# Patient Record
Sex: Male | Born: 1935 | Race: White | Hispanic: No
Health system: Southern US, Community
[De-identification: ages and names within clinical notes are randomized; demographics above are authoritative.]

---

## 2004-07-30 ENCOUNTER — Ambulatory Visit: Payer: Self-pay

## 2006-12-07 ENCOUNTER — Ambulatory Visit: Payer: Self-pay | Admitting: Unknown Physician Specialty

## 2006-12-07 ENCOUNTER — Emergency Department: Payer: Self-pay | Admitting: General Practice

## 2006-12-07 ENCOUNTER — Other Ambulatory Visit: Payer: Self-pay

## 2007-02-01 ENCOUNTER — Encounter: Payer: Self-pay | Admitting: Physical Medicine and Rehabilitation

## 2007-02-27 ENCOUNTER — Encounter: Payer: Self-pay | Admitting: Physical Medicine and Rehabilitation

## 2007-03-29 ENCOUNTER — Encounter: Payer: Self-pay | Admitting: Physical Medicine and Rehabilitation

## 2007-07-06 ENCOUNTER — Other Ambulatory Visit: Payer: Self-pay

## 2007-07-06 ENCOUNTER — Emergency Department: Payer: Self-pay | Admitting: Emergency Medicine

## 2007-07-12 ENCOUNTER — Encounter: Payer: Self-pay | Admitting: Internal Medicine

## 2007-07-30 ENCOUNTER — Encounter: Payer: Self-pay | Admitting: Internal Medicine

## 2009-01-29 ENCOUNTER — Ambulatory Visit: Payer: Self-pay | Admitting: Unknown Physician Specialty

## 2009-03-04 ENCOUNTER — Ambulatory Visit: Payer: Self-pay | Admitting: Pain Medicine

## 2009-03-18 ENCOUNTER — Ambulatory Visit: Payer: Self-pay | Admitting: Physician Assistant

## 2009-04-08 ENCOUNTER — Ambulatory Visit: Payer: Self-pay | Admitting: Pain Medicine

## 2009-04-22 ENCOUNTER — Ambulatory Visit: Payer: Self-pay | Admitting: Pain Medicine

## 2009-05-15 ENCOUNTER — Ambulatory Visit: Payer: Self-pay | Admitting: Physician Assistant

## 2011-07-11 ENCOUNTER — Inpatient Hospital Stay: Payer: Self-pay | Admitting: *Deleted

## 2011-07-11 LAB — URINALYSIS, COMPLETE
Bacteria: NONE SEEN
Bilirubin,UR: NEGATIVE
Blood: NEGATIVE
Glucose,UR: NEGATIVE mg/dL (ref 0–75)
Ketone: NEGATIVE
Nitrite: NEGATIVE
Protein: NEGATIVE
Specific Gravity: 1.021 (ref 1.003–1.030)
Squamous Epithelial: NONE SEEN
WBC UR: 1 /HPF (ref 0–5)

## 2011-07-11 LAB — COMPREHENSIVE METABOLIC PANEL
Albumin: 3.6 g/dL (ref 3.4–5.0)
Alkaline Phosphatase: 44 U/L — ABNORMAL LOW (ref 50–136)
Anion Gap: 8 (ref 7–16)
BUN: 21 mg/dL — ABNORMAL HIGH (ref 7–18)
Bilirubin,Total: 0.4 mg/dL (ref 0.2–1.0)
Calcium, Total: 8.8 mg/dL (ref 8.5–10.1)
Co2: 28 mmol/L (ref 21–32)
EGFR (Non-African Amer.): >60
Glucose: 120 mg/dL — ABNORMAL HIGH (ref 65–99)
Osmolality: 291 (ref 275–301)
SGOT(AST): 20 U/L (ref 15–37)
Sodium: 144 mmol/L (ref 136–145)

## 2011-07-11 LAB — LIPASE, BLOOD: Lipase: 90 U/L (ref 73–393)

## 2011-07-11 LAB — CBC
HCT: 33.8 % — ABNORMAL LOW (ref 40.0–52.0)
HGB: 11.1 g/dL — ABNORMAL LOW (ref 13.0–18.0)
MCHC: 33 g/dL (ref 32.0–36.0)
MCV: 97 fL (ref 80–100)
RBC: 3.49 10*6/uL — ABNORMAL LOW (ref 4.40–5.90)
WBC: 7.4 10*3/uL (ref 3.8–10.6)

## 2011-07-11 LAB — PROTIME-INR: INR: 0.8

## 2011-07-13 LAB — CBC WITH DIFFERENTIAL/PLATELET
Basophil #: 0 10*3/uL (ref 0.0–0.1)
Eosinophil %: 0.9 %
HCT: 31.8 % — ABNORMAL LOW (ref 40.0–52.0)
HGB: 10.4 g/dL — ABNORMAL LOW (ref 13.0–18.0)
Lymphocyte #: 1.7 10*3/uL (ref 1.0–3.6)
Lymphocyte %: 27.3 %
MCV: 97 fL (ref 80–100)
Monocyte %: 13.3 %
Neutrophil %: 58.2 %
Platelet: 185 10*3/uL (ref 150–440)
RBC: 3.28 10*6/uL — ABNORMAL LOW (ref 4.40–5.90)
RDW: 14.5 % (ref 11.5–14.5)
WBC: 6.3 10*3/uL (ref 3.8–10.6)

## 2011-07-14 LAB — CBC WITH DIFFERENTIAL/PLATELET
Basophil %: 0.2 %
Eosinophil #: 0.1 10*3/uL (ref 0.0–0.7)
Eosinophil %: 1 %
HCT: 28.7 % — ABNORMAL LOW (ref 40.0–52.0)
HGB: 9.7 g/dL — ABNORMAL LOW (ref 13.0–18.0)
Lymphocyte #: 1.4 10*3/uL (ref 1.0–3.6)
MCH: 32.2 pg (ref 26.0–34.0)
MCV: 96 fL (ref 80–100)
Platelet: 179 10*3/uL (ref 150–440)
RBC: 3 10*6/uL — ABNORMAL LOW (ref 4.40–5.90)

## 2011-07-16 LAB — CBC WITH DIFFERENTIAL/PLATELET
Basophil %: 0.2 %
HCT: 32.5 % — ABNORMAL LOW (ref 40.0–52.0)
HGB: 11 g/dL — ABNORMAL LOW (ref 13.0–18.0)
Lymphocyte %: 22.4 %
MCV: 97 fL (ref 80–100)
Monocyte #: 1.2 10*3/uL — ABNORMAL HIGH (ref 0.0–0.7)
Monocyte %: 13.6 %
Neutrophil #: 5.8 10*3/uL (ref 1.4–6.5)
Platelet: 212 10*3/uL (ref 150–440)
RBC: 3.36 10*6/uL — ABNORMAL LOW (ref 4.40–5.90)
WBC: 9.1 10*3/uL (ref 3.8–10.6)

## 2011-10-07 ENCOUNTER — Ambulatory Visit: Payer: Self-pay | Admitting: Pain Medicine

## 2011-10-28 ENCOUNTER — Ambulatory Visit: Payer: Self-pay | Admitting: Pain Medicine

## 2012-11-27 ENCOUNTER — Emergency Department: Payer: Self-pay | Admitting: Emergency Medicine

## 2012-11-27 LAB — URINALYSIS, COMPLETE
Bilirubin,UR: NEGATIVE
Glucose,UR: >=500 mg/dL (ref 0–75)
Nitrite: NEGATIVE
RBC,UR: 54 /HPF (ref 0–5)
Specific Gravity: 1.013 (ref 1.003–1.030)

## 2012-11-27 LAB — COMPREHENSIVE METABOLIC PANEL
Albumin: 2.9 g/dL — ABNORMAL LOW (ref 3.4–5.0)
Anion Gap: 5 — ABNORMAL LOW (ref 7–16)
BUN: 16 mg/dL (ref 7–18)
Bilirubin,Total: 0.3 mg/dL (ref 0.2–1.0)
Calcium, Total: 8.8 mg/dL (ref 8.5–10.1)
Chloride: 104 mmol/L (ref 98–107)
Creatinine: 0.93 mg/dL (ref 0.60–1.30)
EGFR (African American): >60
EGFR (Non-African Amer.): >60
Glucose: 273 mg/dL — ABNORMAL HIGH (ref 65–99)
Potassium: 4.4 mmol/L (ref 3.5–5.1)
SGOT(AST): 18 U/L (ref 15–37)
SGPT (ALT): 18 U/L (ref 12–78)
Sodium: 137 mmol/L (ref 136–145)
Total Protein: 6.7 g/dL (ref 6.4–8.2)

## 2012-11-27 LAB — TROPONIN I: Troponin-I: <0.02 ng/mL

## 2012-11-27 LAB — CBC
HCT: 36.8 % — ABNORMAL LOW (ref 40.0–52.0)
MCH: 29.9 pg (ref 26.0–34.0)
MCHC: 33.1 g/dL (ref 32.0–36.0)
MCV: 90 fL (ref 80–100)
RBC: 4.09 10*6/uL — ABNORMAL LOW (ref 4.40–5.90)

## 2012-11-27 LAB — LIPASE, BLOOD: Lipase: 104 U/L (ref 73–393)

## 2013-10-23 ENCOUNTER — Inpatient Hospital Stay: Payer: Self-pay | Admitting: Internal Medicine

## 2013-10-23 LAB — URINALYSIS, COMPLETE
Bilirubin,UR: NEGATIVE
Ketone: NEGATIVE
NITRITE: NEGATIVE
PH: 5 (ref 4.5–8.0)
Specific Gravity: 1.016 (ref 1.003–1.030)
Squamous Epithelial: 1
WBC UR: 263 /HPF (ref 0–5)

## 2013-10-23 LAB — COMPREHENSIVE METABOLIC PANEL
Albumin: 2.6 g/dL — ABNORMAL LOW (ref 3.4–5.0)
Alkaline Phosphatase: 83 U/L
Anion Gap: 7 (ref 7–16)
BUN: 51 mg/dL — ABNORMAL HIGH (ref 7–18)
Bilirubin,Total: 0.2 mg/dL (ref 0.2–1.0)
Calcium, Total: 9.2 mg/dL (ref 8.5–10.1)
Chloride: 100 mmol/L (ref 98–107)
Co2: 26 mmol/L (ref 21–32)
Creatinine: 1.12 mg/dL (ref 0.60–1.30)
EGFR (African American): >60
GFR CALC NON AF AMER: 57 — AB
Glucose: 318 mg/dL — ABNORMAL HIGH (ref 65–99)
OSMOLALITY: 292 (ref 275–301)
Potassium: 4.3 mmol/L (ref 3.5–5.1)
SGOT(AST): 12 U/L — ABNORMAL LOW (ref 15–37)
SGPT (ALT): 14 U/L (ref 12–78)
Sodium: 133 mmol/L — ABNORMAL LOW (ref 136–145)
Total Protein: 6.7 g/dL (ref 6.4–8.2)

## 2013-10-23 LAB — CBC
HCT: 22.9 % — ABNORMAL LOW (ref 40.0–52.0)
HGB: 7.3 g/dL — ABNORMAL LOW (ref 13.0–18.0)
MCH: 29.3 pg (ref 26.0–34.0)
MCHC: 31.9 g/dL — ABNORMAL LOW (ref 32.0–36.0)
MCV: 92 fL (ref 80–100)
Platelet: 305 10*3/uL (ref 150–440)
RBC: 2.49 10*6/uL — ABNORMAL LOW (ref 4.40–5.90)
RDW: 15.6 % — ABNORMAL HIGH (ref 11.5–14.5)
WBC: 14.6 10*3/uL — AB (ref 3.8–10.6)

## 2013-10-23 LAB — TROPONIN I: Troponin-I: 0.13 ng/mL — ABNORMAL HIGH

## 2013-10-24 LAB — CBC WITH DIFFERENTIAL/PLATELET
Basophil #: 0 10*3/uL (ref 0.0–0.1)
Basophil %: 0.1 %
EOS PCT: 1 %
Eosinophil #: 0.1 10*3/uL (ref 0.0–0.7)
HCT: 22 % — ABNORMAL LOW (ref 40.0–52.0)
HGB: 7.2 g/dL — ABNORMAL LOW (ref 13.0–18.0)
LYMPHS PCT: 18.9 %
Lymphocyte #: 1.9 10*3/uL (ref 1.0–3.6)
MCH: 29.8 pg (ref 26.0–34.0)
MCHC: 32.9 g/dL (ref 32.0–36.0)
MCV: 91 fL (ref 80–100)
Monocyte #: 1.9 x10 3/mm — ABNORMAL HIGH (ref 0.2–1.0)
Monocyte %: 18.9 %
Neutrophil #: 6.1 10*3/uL (ref 1.4–6.5)
Neutrophil %: 61.1 %
Platelet: 250 10*3/uL (ref 150–440)
RBC: 2.43 10*6/uL — AB (ref 4.40–5.90)
RDW: 14.9 % — AB (ref 11.5–14.5)
WBC: 10 10*3/uL (ref 3.8–10.6)

## 2013-10-24 LAB — BASIC METABOLIC PANEL
ANION GAP: 5 — AB (ref 7–16)
BUN: 41 mg/dL — AB (ref 7–18)
CO2: 28 mmol/L (ref 21–32)
Calcium, Total: 7.9 mg/dL — ABNORMAL LOW (ref 8.5–10.1)
Chloride: 105 mmol/L (ref 98–107)
Creatinine: 0.98 mg/dL (ref 0.60–1.30)
EGFR (African American): >60
EGFR (Non-African Amer.): >60
Glucose: 140 mg/dL — ABNORMAL HIGH (ref 65–99)
Osmolality: 288 (ref 275–301)
POTASSIUM: 3.7 mmol/L (ref 3.5–5.1)
SODIUM: 138 mmol/L (ref 136–145)

## 2013-10-25 ENCOUNTER — Ambulatory Visit: Payer: Self-pay | Admitting: Internal Medicine

## 2013-10-25 LAB — URINE CULTURE

## 2013-11-26 DEATH — deceased

## 2014-10-19 NOTE — H&P (Signed)
PATIENT NAME:  Lance Sanchez, Lance Sanchez MR#:  [id number] DATE OF BIRTH:  Dec 28, 1919  DATE OF ADMISSION:  10/23/2013  PRIMARY CARE PHYSICIAN: Dr. Hyacinth Meeker.    REFERRING EMERGENCY ROOM PHYSICIAN: Dr. Mayford Knife.   CHIEF COMPLAINT: Drowsiness and fall.   HISTORY OF PRESENT ILLNESS:  A 81 year old male with a past medical history of diabetes, hypertension, benign prostatic hypertrophy, craniotomy in the past for subdural hematoma, chronic dementia; lives at home with 24/7 home care with diverticular bleed in 2013. Had a fall on the 24th of April but then was not complaining much so family just continued taking care of him at home but then, for the last 2 days, he gradually started becoming more and more sleepy and remaining more drowsy but arousable so they finally he decided today to bring him to the Emergency Room. On workup he was found having a low hemoglobin. CT scan of the abdomen was done which showed there is a small displaced fracture of his rib with small hemothorax. His stool is also positive as per ER physician's rectal examination and also found having a urinary tract infection so he was given to hospitalist service for further management of this issue. History obtained from patient's son, who is present in the room. He is also healthcare power of attorney named Lance Sanchez.    REVIEW OF SYSTEMS: Unable to get it as the patient is currently demented and drowsy.   PAST MEDICAL HISTORY: 1.  Diabetes.  2.  Hypertension.  3.  History of benign prostatic hypertrophy.  4.  Craniotomy in the past because of cervical hematoma.  5.  Right femoral fracture, treated at [facility] orthopedics.  6.  Diverticular bleed in 2013.   The patient's son is questionable about history of hypertension as the patient does not require any blood pressure medications and pressure in remaining normal.   PAST SURGICAL HISTORY: 1.  Neck surgery.  2.  Appendectomy.  3.  Prostate biopsy.  4.  Bilateral inguinal hernia  repair.  5.  Hydrocele repair.  6.  Craniotomy for subdural hematoma in [facility].  7.  Surgery of right femoral neck fracture.   FAMILY HISTORY: Positive for hypertension.   SOCIAL HISTORY: The patient lives at home with 24/7 care, able to walk minimal with walker but still requires 1 person to hold him. He also requires somewhat assistance in feeding and has terminal dementia, not having any meaningful conversation with any family members. He is nonsmoker. He still chews tobacco. No alcohol use. No illegal drug use. 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   HOME MEDICATIONS:  1.  Tamsulosin 0.4 mg oral tablet once a day.  2.  Repaglinide 0.5 mg oral tablet once a day.  3.  Multivitamin tablet with iron once a day.  4.  Docusate sodium 100 mg oral tablet once a day.  5.  Avodart 0.5 mg oral once a day.  6.  Acetaminophen extended-release tablet 650 mg as needed 8 eight hours for pain.   PHYSICAL EXAMINATION: VITAL SIGNS: In the ER, temperature 97.6, pulse 97, respirations 18, blood pressure 101/60 and pulse oximetry is 96% on room air.  GENERAL: The patient is currently drowsy and he is confused, not oriented but arousable to shake and opens eyes and speaks few nonmeaningful words. HEENT: Head and neck atraumatic. Conjunctivae pale. Oral mucosa moist.  NECK: Supple. No JVD.  RESPIRATORY: Bilateral equal air entry. There is tenderness on local pressure on the left side  of posterior chest.    CARDIOVASCULAR: S1, S2 present, murmur present, regular.  ABDOMEN: Soft, nontender, bowel sounds present, no organomegaly.  SKIN: No rashes.  LEGS: No edema.  NEUROLOGICAL: Power 4 out of 5. Moves all 4 limbs but does not follow commands because of dementia. No rigidity or tremors.   LABORATORY, DIAGNOSTIC AND RADIOLOGICAL DATA:  CT abdomen and pelvis with contrast:  1.  Displaced fracture of the posterolateral aspect of left 10th rib.  2.  Small left hemothorax associated with left basilar  segmental atelectasis.  3.  No evidence of acute injury to the abdomen or pelvis.  4.  Cholelithiasis. 5.  Colonic diverticular disease without CT evidence of active inflammation.    6.  Irregular and diffuse intractable bladder wall with left posterolateral diverticulum, overall finding appears similar compared to past.  7.  Chronic-appearing T12 fracture.  8.  IVC filter.  9.  Diffuse atherosclerotic vascular dilatation.   CT head without contrast is done, diffuse cortical atrophy, mild chronic ischemia, stable bilateral chronic hygromas compared to prior exam, no acute intracranial abnormality. Glucose 318, BUN 51, creatinine 1.12, sodium 133, potassium is 4.3, chloride is 100, CO2 is 26, calcium is 9.2.   Total protein 6.7, albumin is 2.6, bilirubin 0.2, alkaline phosphatase 83, SGOT 12 and SGPT is 14.   Troponin 0.13.   WBC 14.6, hemoglobin 7.3, platelet count is 305, MCV is 92. Urinalysis is positive with 263 WBCs and 3+ leukocyte esterase.   ASSESSMENT AND PLAN: A 81 year old male with past medical history of dementia, diabetes, hypertension and on 24/7 home care brought by family after a fall and feeling remaining drowsy and not very oriented.  1.  Altered mental status due to metabolic encephalopathy due to urinary tract infection. The patient has baseline dementia but he remains alert. No meaningful conversation, but for the last 2 to 3 days he is remaining more drowsy. This is most likely secondary to urinary tract infection. We will give Rocephin IV and get urine culture for further evaluation.  2.  Gastrointestinal bleed and drop in hemoglobin. As per Emergency Room physician stool is positive for heme on rectal exam.  Hemoglobin last was 12 in the records and currently it is 7. Blood transfusion ordered by ER physician and we will continue monitoring. Will follow CBC tomorrow morning. Gastroenterology consult. Proton pump inhibitor IV b.i.d. and I avoid all anticoagulant medications.   3.  Diabetes. Currently, because of drowsiness his oral intake will be questionable  and I would like to avoid his diabetes medication but just would like to put him on low-dose sliding scale coverage with fingerstick check to prevent excessive hyperglycemia.  4.  Rib fracture and small hemothorax. As per CAT scan today, it is very small. The patient had a fall 4 days ago and still it is very small so I do not think it needs any particular intervention but if it keeps on increasing, then we might need to call cardiothoracic surgery consult  5.  Weakness and history of fall. Will call physical therapy evaluation. The patient is having a 24/7 care anyway.  6.  Overall prognosis is very poor because of multiple medical issues and extreme old age. I discussed with the patient's son, who is healthcare power of attorney, Mr. Lance Sanchez.  He wants to keep the patient as DO NOT RESUSCITATE in any adverse event. Plan explained to him about gastrointestinal bleed, urinary tract infection and rib fracture as mentioned above and he  agreed with the plan.   TOTAL TIME SPENT ON THIS ADMISSION: 50 minutes.    ____________________________ Hope Pigeon Elisabeth Pigeon, MD vgv:cs D: 10/23/2013 19:49:00 ET T: 10/23/2013 20:24:01 ET JOB#: [id number]  cc: Hope Pigeon. Elisabeth Pigeon, MD, <Dictator> Altamese Dilling MD ELECTRONICALLY SIGNED 11/05/2013 22:15

## 2014-10-19 NOTE — Discharge Summary (Signed)
PATIENT NAME:  Lance Sanchez, Lance Sanchez MR#:  [id number] DATE OF BIRTH:  January 18, 1920  DATE OF ADMISSION:  10/23/2013 DATE OF DISCHARGE:  10/24/2013  ADMITTING DIAGNOSIS: Fall, drowsiness.   DISCHARGE DIAGNOSES:  1.  Acute encephalopathy due to urinary tract infection, anemia, now patient made comfort care due to progression of his dementia.  2.  Gastrointestinal bleed. No further workup wanted by family. Hemoglobin 7. They did not wish for him to be transfused. The patient is made comfort care and transferred to hospice home.  3.  Diabetes.  4.  Rib fracture with small pneumothorax.  5.  History of benign prostatic hypertrophy.  6.  History of cranial hematoma in the past with craniotomy. 7.  History of right femoral fracture in the past.  8.  History of the diverticular bleed in 2013.  9.  Status post neck surgery, appendectomy, prostate biopsy, bilateral inguinal hernia repair, hydrocele repair, craniotomy and right femoral neck fracture repair.   CONSULTANTS: Palliative care.  LABS AND EVALUATIONS: CT scan of the head showed diffuse cortical atrophy, mild chronic ischemia and bilateral chronic hygromas, unchanged. Glucose 318, BUN 51, creatinine 1.12, sodium 133, potassium 4.3, chloride 100, CO2 26, calcium 9.2. LFTs: Total protein 6.7, albumin 2.6, bilirubin total 0.2, alk phos 83, AST 12, ALT 14. Troponin 0.13. WBC 14.6, hemoglobin 7.3. Urinalysis positive at 263 WBCs and 3+ leukocytes.   HOSPITAL COURSE: Please refer to H and P done by the admitting physician. The patient is a 81 year old white male with history of progressive dementia and other medical problems, who fell on April 24. He was not complaining much of pain, but was brought into the ED with an increase in drowsiness and more confusion. He has baseline dementia and was being cared for at home. He underwent evaluation, which showed a small displaced fracture with small hemothorax. He also was noted to have a urinary tract infection and  we were asked to admit the patient. The patient also was found to have drop in his hemoglobin and was guaiac-positive. There is some concern for chronic GI blood loss. Due to these symptoms, he was admitted and placed on IV antibiotics and IV fluids. A GI consult was obtained as well as palliative care consult was obtained. The patient was seen by palliative care and case was discussed with the family and the son, healthcare power of attorney. He felt that the patient has progressively declined and with his dementia he did not want any aggressive treatment and wanted him to be comfortable much as possible. Therefore, hospice was consulted. Te patient was felt appropriate for home hospice. Therefore, he is being discharged to a hospice facility.   DISCHARGE ACTIVITY: As tolerated.   DISCHARGE MEDICATIONS: Lumigan 0.01% ophthalmic solution 1 drop to each eye in the evening, bimatoprost 0.01% ophthalmic drop to each eye at bedtime, morphine 20 mg per mL, 0.13 mL q.4 hours p.r.n. and Ativan 0.5 q.4 hours p.r.n. anxiety.   DISPOSITION: To hospice home.  TIME SPENT: 32 minutes spent on this discharge.  ____________________________ Lafonda Mosses. Posey Pronto, MD shp:aw D: 10/25/2013 07:48:19 ET T: 10/25/2013 08:01:18 ET JOB#: [id number]  cc: Calil Amor H. Posey Pronto, MD, <Dictator> Alric Seton MD ELECTRONICALLY SIGNED 10/26/2013 14:05

## 2014-10-20 NOTE — H&P (Signed)
PATIENT NAME:  Lance Sanchez, Lance Sanchez MR#:  [id number] DATE OF BIRTH:  09-Apr-1920  DATE OF ADMISSION:  07/11/2011  PRIMARY CARE PHYSICIAN: Silver Huguenin, MD  CHIEF COMPLAINT: Bright red blood per rectum today.   HISTORY OF PRESENT ILLNESS: History is obtained from the patient, old records and the patient's daughter.   HISTORY OF PRESENT ILLNESS: Mr. [name] is an 81 year old Caucasian gentleman with history of diabetes and benign prostatic hypertrophy who comes to the Emergency Room accompanied by his daughter and son with the above-mentioned chief complaint. The patient's daughter reports that around 3 o'clock  in the morning she heard the patient in the bathroom and went to check on him and found a lot of blood in the commode, outside the commode and in the bathroom along with some blood clots near the patient's bed. The patient denied any abdominal pain. He had another episode while they were trying to clean him up in the shower and they decided to bring him to the Emergency Room for further evaluation and management. The patient does have history of diverticulosis per colonoscopy done several years ago. He is hemodynamically stable. He is being admitted for further evaluation and management. The patient denies any abdominal pain, nausea, vomiting, or any recent intestinal infection.   PAST MEDICAL HISTORY:  1. Diabetes.  2. Hypertension.  3. History of benign prostatic hypertrophy.  4. Craniotomy in the past for subdural hematoma.  5. Right femoral neck fracture, treated by [facility].  PAST SURGICAL HISTORY:  1. Neck surgery  2. Appendectomy.  3. Prostate biopsy.  4. Bilateral inguinal hernia repair.  5. Hydrocele repair.  6. Craniotomy for subdural hematoma at [facility].  7. Surgery for right femoral neck fracture.   ALLERGIES: No known allergies.   MEDICATIONS:  1. Aspirin 81 mg daily.  2. Avodart 0.5 mg daily.  3. Calcium with vitamin D p.o. daily.  4. Fish oil p.o. daily.   5. Flomax 0.4 mg p.o. daily.  6. Glucosamine 500 mg p.o. daily.  7. Multivitamin p.o. daily.  8. Prandin 0.5 mg p.o. daily.  9. Tramadol 50 mg two tablets every six hours as needed for pain.  10. Travatan 0.004% ophthalmic solution one drop in each eye daily.  11. Tylenol Arthritis.  FAMILY HISTORY: Positive for hypertension.   REVIEW OF SYSTEMS: CONSTITUTIONAL: No fever, fatigue, or weakness. EYES: No blurred or double vision. ENT: No blurred or double vision. Positive for glaucoma. ENT: No tinnitus, ear pain, or hearing loss. RESPIRATORY: No cough, wheeze, or hemoptysis. CARDIOVASCULAR: No chest pain, orthopnea, or edema. GASTROINTESTINAL: Positive for rectal bleed No nausea, vomiting, diarrhea, or abdominal pain. No GERD. GU: No dysuria or hematuria. ENDOCRINE: No polyuria or nocturia. HEMATOLOGY: Positive for anemia. SKIN: No acne or rash. MUSCULOSKELETAL: Positive for arthritis. NEUROLOGIC: No cerebrovascular accident or transient ischemic attack. PSYCH: No anxiety or depression. All other systems are reviewed and negative.   PHYSICAL EXAMINATION:   GENERAL: Awake, alert, and oriented x3, not in acute distress.   VITAL SIGNS: Afebrile, pulse 78, blood pressure 130/73, and saturations are 100% on room air.   HEENT: Atraumatic, normocephalic. Pupils are equal, round, and reactive to light and accommodation. Extraocular movements intact. Oral mucosa is moist.   NECK: Supple. No JVD. No carotid bruit.   LUNGS: Clear to auscultation bilaterally. No rales, rhonchi, respiratory distress, or labored breathing.   HEART: Both heart sounds are normal. Rate and rhythm is regular. PMI is not lateralized. Chest is nontender.   EXTREMITIES: Good  pedal pulses, good femoral pulses. No lower extremity edema.   ABDOMEN: Soft, benign, and nontender. No organomegaly.  RECTAL: Hemoccult shows bright red blood, per emergency room physician.  NEUROLOGIC: Cranial nerves II through XII grossly intact.  No motor or sensory deficits.   PSYCH: Awake, alert, and oriented x3.   SKIN: Warm and dry.  LABS/STUDIES: CT of the head without contrast: No acute intracranial findings. There are findings suggesting small chronic CSF hygroma along the left parietal convexity similar to prior, chronic small vessel ischemic disease.  Hemoglobin and hematocrit is 11.1 and 33.8, and platelet count 203. Lipase is 90. Comprehensive metabolic panel is within normal limits, except glucose of 120, BUN 21, chloride 108, and alkaline phosphatase 44. PT and INR are 11.8 and 0.8. Troponin is 0.03.   Chest x-ray: No acute cardiopulmonary disease.   ASSESSMENT: 81 year old Mr. Lance Sanchez with:  1. Rectal bleed, acute, and appears to be diverticular at present. We need to rule out other causes like ischemic colitis versus malignancy, which is less likely. The patient had acute bright red blood per rectum with clots this morning without abdominal pain which favors more for diverticular bleed. He has had colonoscopy in the past, about 10 years ago, which did show diverticulosis.  2. Benign prostatic hypertrophy.  3. Type 2 diabetes.  4. Glaucoma.   PLAN:  1. Admit the patient to the medical floor.  2. Continue IV fluids.  3. NPO except ice and medications.  4. Bleeding scan if the patient has another bleed. Vascular consult if needed 5. PRN. in and out catheter since the patient has history of significant benign prostatic hypertrophy.  6. TED hose for deep vein thrombosis prophylaxis.   Further work-up according to the patient's clinical course. The hospital admission plan was discussed with the patient and the patient's family members who are agreeable to it.  CODE STATUS: FULL CODE.  TIME SPENT: 50 minutes. ____________________________ Wylie HailSona A. Allena KatzPatel, MD sap:slb D: 07/11/2011 12:06:38 ET T: 07/11/2011 12:38:47 ET JOB#: [id number]  cc: Danikah Budzik A. Allena KatzPatel, MD, <Dictator> Yetta FlockAileen H. Miller, MD Willow OraSONA A Saamir Armstrong  MD ELECTRONICALLY SIGNED 07/13/2011 15:25

## 2014-10-20 NOTE — Consult Note (Signed)
Chief Complaint:   Subjective/Chief Complaint disoriented, poorly cooperative-"sundowning"  denies abd pain, no vomiting reported.  several small bm, bloody. hemodynamically stable   VITAL SIGNS/ANCILLARY NOTES: **Vital Signs.:   14-Jan-13 14:02   Vital Signs Type Routine   Temperature Temperature (F) 98   Celsius 36.6   Temperature Source oral   Pulse Pulse 91   Pulse source per Dinamap   Respirations Respirations 20   Systolic BP Systolic BP 165   Diastolic BP (mmHg) Diastolic BP (mmHg) 70   Mean BP 101   BP Source Dinamap   Pulse Ox % Pulse Ox % 96   Pulse Ox Activity Level  At rest   Oxygen Delivery Room Air/ 21 %  *Intake and Output.:   14-Jan-13 14:01   Stool  medium sized amount of dark colored stool.  when I wiped him it was a lot of blood.    14:54   Stool  small soft stool, dark red    17:05   Stool  small amount of blood stool when cleaned bottom,  none in depends.   Brief Assessment:   Cardiac Regular    Respiratory clear BS    Gastrointestinal details normal Soft  Nontender  Nondistended  No masses palpable   Routine Hem:  14-Jan-13 02:06    Hemoglobin (CBC) 9.4    10:06    Hemoglobin (CBC) 10.4   Radiology Results: Nuclear Med:    13-Jan-13 22:50, GI Blood Loss Study - Nuc Med   GI Blood Loss Study - Nuc Med    REASON FOR EXAM:    LGI bleed  COMMENTS:       PROCEDURE: NM  - NM GI BLOOD LOSS STUDY  - Jul 11 2011 10:50PM     RESULT: Comparison: None.    Radiopharmaceutical: 21.09 mCi technetium 107100m.    Technique: Standard departmental tagged red cell scan wasperformed with   monitoring via anterior planar images.    Findings:  There is normal distribution of radiotracer activity. There is a small   focus of radiotracer activity just lateral to the left iliac vessels.     This persists throughout the study, unchanged in intensity or morphology.   It does not peristalse. This is of uncertain etiology, but may a small   vascular anomaly or an  area of inflammation. No areas of peristalsing   radiotracer activity to suggest active hemorrhage into the bowel.    There is radiotracer activity overlying the region of the penis, likely   secondary to urinary contamination.    IMPRESSION:     1. No evidence of active hemorrhage into the bowel.  2. Small focus of radiotracer activity just lateral to the leftiliac   vessels is of uncertain etiology. This could represent a small vascular   anomaly or an area of inflammation.      Verified By: Lewie ChamberOBERT L. SUBER, M.D., MD   Assessment/Plan:  Assessment/Plan:   Assessment 1) hematochezia-likely diverticular bleeding.  stable not recurrent, passing old bleed.  DDX including anal outlet or avm's.  hgb stable.  negative bleeding scan last night.  2) ams/early dementia    Plan 1) continue current, recheck cbc in am, transfuse as needed. following.   Electronic Signatures: Barnetta ChapelSkulskie, Allah Reason (MD)  (Signed 14-Jan-13 18:04)  Authored: Chief Complaint, VITAL SIGNS/ANCILLARY NOTES, Brief Assessment, Lab Results, Radiology Results, Assessment/Plan   Last Updated: 14-Jan-13 18:04 by Barnetta ChapelSkulskie, Armend Hochstatter (MD)

## 2014-10-20 NOTE — Consult Note (Signed)
PATIENT NAME:  Lance Sanchez, VINER MR#:  [id number] DATE OF BIRTH:  1920/03/07  DATE OF CONSULTATION:  07/11/2011  REFERRING PHYSICIAN:  Dr. Enedina Finner  CONSULTING PHYSICIAN:  Christena Deem, MD  REASON FOR CONSULTATION: Hematochezia.   HISTORY OF PRESENT ILLNESS: Mr. Melody is an 81 year old Caucasian male who was brought to the Emergency Room today by his family members with some problems of rectal bleeding. Patient is unable to give much in the way of medical history in that he has some significant amount of dementia. Apparently patient was okay yesterday. He has perhaps had some increased problems with constipation or bowel habit changes recently. This morning it was noted that there was some blood on the floor in the bathroom with some clots, this occurring before 6:00. This was then repeated after a shower at 8:30 with some brighter red bleeding associated with some dark clots as well whereupon he was brought to the hospital. He denies any problems with abdominal pain, nausea, or vomiting. He does have heartburn for which he takes TUMS on a regular basis. There is no apparent dysphagia. He has never had a GI bleed in the past according to family members present in the room. Most of the history is obtained from either one of his sons, his daughter or daughter-in-law. Typically he has a near daily bowel movement. He has possibly had diverticulitis in the past. GI family history is pertinent for a younger brother having colon cancer. Patient's last colonoscopy was 07/30/2004 with finding of diverticulosis at that time. He apparently has never had colon polyps. He has not had a repeat colonoscopy due to these age-related issues. He is hemodynamically stable. He has not had a repeat episode of bleeding since he came to the hospital.   PAST MEDICAL HISTORY:  1. History of non-insulin-dependent diabetes.  2. Hypertension.  3. Benign prostatic hypertrophy.  4. He has had a subdural hematoma in the past  requiring craniotomy.  5. He has had a right femoral neck fracture treated at [facility].   PAST SURGICAL HISTORY: 1. Neck surgery. 2. Appendectomy. 3. Prostate biopsy. 4. Bilateral inguinal herniorrhaphy. 5. Hydrocele repair.  6. Craniotomy as noted.  7. Surgery for his right femoral neck fracture.   OUTPATIENT MEDICATIONS:  1. Avodart 0.5 mg once a day. 2. Flomax 0.4 mg. 3. Multivitamin.  4. Prandin 0.5 mg t.i.d. with meals. 5. Tamsulosin as noted.  6. Travatan eyedrops. 7. Tylenol arthritis. 8. Vitamin supplement for hair.  9. In the past patient has been on calcium supplements, fish oil, glucosamine and tramadol of which these he is no longer taking.   ALLERGIES: He has no known drug allergies.   REVIEW OF SYSTEMS: Systems reviewed per initial history and physical, agree with same.   PHYSICAL EXAMINATION:  VITAL SIGNS: Temperature 98.2, pulse 75, respirations 18, blood pressure 162/74, pulse oximetry 96%.   GENERAL: He is a well-appearing 81 year old Caucasian male, no acute distress.   HEENT: Normocephalic, atraumatic. Eyes are anicteric. Nose: Septum midline. Oropharynx: He has a plate upper and lower. There are no lesions.   NECK: Supple. No JVD.   HEART: Regular rate and rhythm.   LUNGS: Bilaterally clear.   ABDOMEN: Soft. There is some mild tenderness to palpation in the left lower quadrant. There are no masses, rebound, organomegaly. The tenderness in the left lower quadrant is repeatable on subsequent exam.   RECTAL: Anorectal exam deferred.   EXTREMITIES: No clubbing, cyanosis. He does have a 1 to 2+ lower extremity  edema.   NEUROLOGICAL: Cranial nerves II through XII grossly intact. Muscle strength bilaterally equal and symmetric.   LABORATORY, DIAGNOSTIC, AND RADIOLOGICAL DATA: Serum glucose 120, BUN 21, creatinine 1.08, sodium 144, potassium 4.2, chloride 108, bicarbonate 28, calcium 8.8, lipase 90. Hepatic profile showing total protein 6.6, albumin 3.6, total  bilirubin 0.4, alkaline phosphatase 44, AST 20, ALT 21. He has had troponin x1 which was 0.03. Hemogram shows white count 7.4, hemoglobin and hematocrit 11./33.8, platelet count 203. MCV 97. He has been typed and screened. Pro time 11.9 with an INR 0.8. Urinalysis was normal. He had a CT scan of the head without contrast in regards to altered mental status and history of intracranial bleed in the past. No acute intracranial findings. 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 Chronic small vessel ischemic disease. He has had a portable chest film showing no acute cardiopulmonary disorder.   ASSESSMENT: Hematochezia. This is in the setting of a known history of diverticulosis in the past. There is a question as to whether he may have had diverticulitis in the past. He has some mild tenderness in the left lower quadrant. He does not state that he has had any pain recently. Family members also verify that he has not had any complaint of pain even with this episode. Most likely this is a diverticular bleed. Differential diagnosis to include anal outlet bleeding as well as diverticular bleeding or AVM type bleeding. Doubt that this would be ischemic colitis. I am concerned that with the mild discomfort in the left lower quadrant he may have some amount of chronic diverticulitis. He is hemodynamically stable and is not yet at a point requiring transfusion. He has had no repeat episode of bleeding for over six hours.   RECOMMENDATIONS:  1. Serial hemoglobins; would repeat q.8 hours x3 then every day. Transfuse as needed.  2. Consider acid blocker for prophylaxis H2 RA versus PPI.  3. Will consider placing him on Cipro and Flagyl in regards to left lower quadrant discomfort. In review, he does not have a high white count.       4. Should he have repeat bleeding, would do a bleeding scan to help with localization as well as a vascular surgery  consult in regards to possible microembolization.   ____________________________ Christena DeemMartin U. Skulskie, MD mus:cms D: 07/11/2011 16:01:20 ET T: 07/12/2011 05:18:04 ET JOB#: [id number]  cc: Christena DeemMartin U. Skulskie, MD, <Dictator> Danella PentonMark F. Miller, MD Christena DeemMARTIN U SKULSKIE MD ELECTRONICALLY SIGNED 07/20/2011 17:26

## 2014-10-20 NOTE — Discharge Summary (Signed)
PATIENT NAME:  Lance Sanchez, Lance Sanchez MR#:  [id number] DATE OF BIRTH:  08/12/1919  DATE OF ADMISSION:  07/11/2011 DATE OF DISCHARGE:  07/18/2011  DISCHARGE DIAGNOSES:  1. Rectal bleed secondary to diverticular bleed.  2. Diabetes.  3. Dementia.  4. Benign prostatic hypertrophy.  5. Hypertension.  6. History of craniotomy in the past for subdural hematoma. 7. Previous history of right femoral neck fracture.   CONSULTATION: Gastroenterology, Dr. Marva PandaSkulskie.   HISTORY: This is an 81 year old male who has history of suspected dementia, diabetes, hypertension, who presented with rectal bleed. The daughter found a lot of blood in the commode. He denied any abdominal pain at that time. He was admitted as painless rectal bleed, possibly diverticular. When he came in, his hemoglobin was 11.1. He had a nuclear bleeding scan done. There was no evidence of active hemorrhage in the bowel. Small focus of radiotracer activity just lateral to the left iliac vessels which could be a vascular anomaly or area of inflammation. He also had a CT of the head done in the Emergency Room because of altered mental status that was essentially negative. 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 He was kept n.p.o. His hemoglobin was monitored. His hemoglobin remained stable in the range of 9.4 to 10.4. His last hemoglobin checked on 01/18 actually improved to 11. He did not require any packed RBC transfusion. His INR when he came in was normal at 0.8. His platelet count was normal at 2,000 or 3000. Dr. Marva PandaSkulskie followed him during the hospital stay. He also had episodes of disorientation and sundowning during the hospital stay. He had to be given Risperdal for agitation, but he is back to his baseline. Most likely he has early dementia. He has been started on a low residue diet for his diverticular bleed. He is tolerating it well. It can be transitioned to 1,800 calorie ADA  diet after a week. We will also start him on MiraLAX as needed. Initially, the patient was planned to be discharged to home, but he did not do well with physical therapy, so the patient is getting discharged to short-term rehab.   DISCHARGE MEDICATIONS:  1. Multivitamin daily.  2. Tylenol Arthritis extended-release 650 milligrams one tab every eight hours as needed. 3. Avodart 0.5 mg daily.  4. Tamsulosin 0.4 mg daily. 5. Vitamins for hair tablet once daily.  6. Travatan eye drops one drop to each affected eye once a day in the evening. 7. Prandin 0.5 mg p.o. at bedtime. 8. Risperdal 0.5 mg at bedtime as needed for agitation. 9. MiraLAX 17 grams p.o. once daily as needed for constipation.   DIET: 1,800 calorie ADA low residue diet.   ACTIVITY: As tolerated. Physical therapy at short-term rehab.   CONDITION ON DISCHARGE: He is comfortable. His T-max 98, heart rate 75, blood pressure 145/72, oxygen saturation 96% on room air. Chest is clear. Heart sounds are regular. Abdomen soft, nontender. He has no further bleed. He is tolerating his diet.   FOLLOWUP: The patient should follow-up with M.D. at short-term rehab or Dr. Silver HugueninAileen Sanchez in one week.   TIME SPENT WITH DISCHARGE: 35 minutes.   ____________________________ Fredia SorrowAbhinav Jarom Govan, MD ag:ap D: 07/18/2011 11:42:31 ET T: 07/18/2011 12:05:05 ET JOB#: [id number]  cc: Fredia SorrowAbhinav Lilliemae Fruge, MD, <Dictator> Yetta FlockAileen Sanchez. Miller, MD  Fredia SorrowABHINAV Ena Demary MD ELECTRONICALLY SIGNED 08/18/2011 17:58

## 2014-10-20 NOTE — Consult Note (Signed)
Brief Consult Note: Diagnosis: hematochezia.   Patient was seen by consultant.   Consult note dictated.   Recommend further assessment or treatment.   Orders entered.   Discussed with Attending MD.   Comments: Please see GI consult [phone]#[id number].  Paitent with dark to bright red rectal bleeding this am.  Hemodynamically stable.  No repeat for over 6 hours.  Mild abdominal pain in the left lower quadrant.  Serial hgb, transfuse as needed.  For repeat bleeding would do GI bleeding scan and consult vascular surgery re possible microembolization.  Low threshold for starting ivabx with the mild llq pain should he have repeat bleeding. Following. Thank you for this consult.  Electronic Signatures: Barnetta ChapelSkulskie, Karolynn Infantino (MD)  (Signed 13-Jan-13 16:05)  Authored: Brief Consult Note   Last Updated: 13-Jan-13 16:05 by Barnetta ChapelSkulskie, Naitik Hermann (MD)

## 2014-10-20 NOTE — Consult Note (Signed)
Chief Complaint:   Subjective/Chief Complaint stable, sleepy currently, no complaint, denies abdominal pain or nausea.   VITAL SIGNS/ANCILLARY NOTES: **Vital Signs.:   17-Jan-13 13:45   Vital Signs Type Routine   Temperature Temperature (F) 98.3   Celsius 36.8   Temperature Source oral   Pulse Pulse 76   Pulse source per Dinamap   Respirations Respirations 18   Systolic BP Systolic BP 153   Diastolic BP (mmHg) Diastolic BP (mmHg) 64   Mean BP 93   BP Source Dinamap   Pulse Ox % Pulse Ox % 99   Pulse Ox Activity Level  At rest   Oxygen Delivery Room Air/ 21 %   Brief Assessment:   Cardiac Regular    Respiratory clear BS    Gastrointestinal details normal Soft  Nontender  Nondistended  No masses palpable  Bowel sounds normal   Assessment/Plan:  Assessment/Plan:   Assessment 1) lower GI bleed, likely diverticular in nature.  stable, not recurrent.    Plan 1) stable for d/c from GI standpoint.  would place on low residue diet for a week, start miralax daily in 3-4 days.  signing off.   Electronic Signatures: Barnetta ChapelSkulskie, Davell Beckstead (MD)  (Signed 17-Jan-13 15:42)  Authored: Chief Complaint, VITAL SIGNS/ANCILLARY NOTES, Brief Assessment, Assessment/Plan   Last Updated: 17-Jan-13 15:42 by Barnetta ChapelSkulskie, Gerry Heaphy (MD)

## 2014-10-20 NOTE — Consult Note (Signed)
Chief Complaint:   Subjective/Chief Complaint minimal evidence of gi bleeding.  stable labs and hemodynamically. denies abd pain or nausea.   VITAL SIGNS/ANCILLARY NOTES: **Vital Signs.:   15-Jan-13 15:06   Temperature Temperature (F) 97.9   Celsius 36.6   Temperature Source oral   Pulse Pulse 64   Respirations Respirations 20   Systolic BP Systolic BP 171   Diastolic BP (mmHg) Diastolic BP (mmHg) 68   Mean BP 102   Pulse Ox % Pulse Ox % 97   Pulse Ox Activity Level  At rest   Oxygen Delivery Room Air/ 21 %  *Intake and Output.:   15-Jan-13 15:29   Stool  Small amount of soft dark red stool    16:32   Stool  Smear of dark red stool   Brief Assessment:   Cardiac Regular    Respiratory clear BS    Gastrointestinal details normal Soft  Nontender  Nondistended  No masses palpable  Bowel sounds normal     Routine Hem:  13-Jan-13 18:33    Hemoglobin (CBC) 10.2  14-Jan-13 02:06    Hemoglobin (CBC) 9.4    10:06    Hemoglobin (CBC) 10.4  15-Jan-13 04:54    Hemoglobin (CBC) 10.4   WBC (CBC) 6.3   RBC (CBC) 3.28   Hematocrit (CBC) 31.8   Platelet Count (CBC) 185   MCV 97   MCH 31.8   MCHC 32.8   RDW 14.5   Neutrophil % 58.2   Lymphocyte % 27.3   Monocyte % 13.3   Eosinophil % 0.9   Basophil % 0.3   Neutrophil # 3.7   Lymphocyte # 1.7   Monocyte # 0.8   Eosinophil # 0.1   Basophil # 0.0   Assessment/Plan:  Assessment/Plan:   Assessment 1) hematochezia-stable.  possible diverticular versus anal outlet bleeding.  2) ams, confusion-much improved on risperdol    Plan 1) will restart full liquids.  monitor for next 24 hours.  if recurrent fresh bleding may consider flex sig.   Electronic Signatures for Addendum Section:  Barnetta ChapelSkulskie, Lasonya Hubner (MD) (Signed Addendum 15-Jan-13 16:43)  discussed with Dr Dava NajjarPanwar   Electronic Signatures: Barnetta ChapelSkulskie, Dorlis Judice (MD)  (Signed 15-Jan-13 16:42)  Authored: Chief Complaint, VITAL SIGNS/ANCILLARY NOTES, Brief Assessment, Lab Results,  Assessment/Plan   Last Updated: 15-Jan-13 16:43 by Barnetta ChapelSkulskie, Immanuel Fedak (MD)

## 2015-12-04 IMAGING — CT CT ABD-PELV W/ CM
2 of 5 series · 15 of 46 positions shown, 17 images · IV contrast (isovue)
Comparison: Prior CT abdomen/ pelvis 11/27/2012

CLINICAL DATA: Fall on [DATE] with left arm and rib cage pain.

EXAM:
CT ABDOMEN AND PELVIS WITH CONTRAST
TECHNIQUE: Multidetector CT imaging of the abdomen and pelvis was performed
using the standard protocol following bolus administration of
intravenous contrast.
CONTRAST:  100 mL Isovue-C00

[Series 2: routine abd pel with · axial · 0.78mm/px · z∈[-1446,-1036]mm · 12 of 94 slices shown, 14 images]
[im 6/94  soft-tissue]
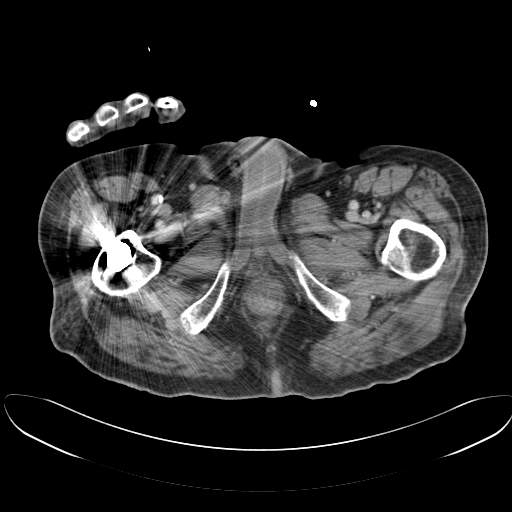
[im 6/94  bone]
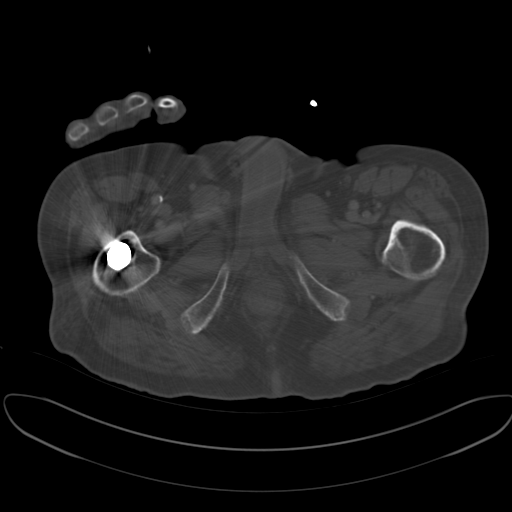
[im 17/94  soft-tissue]
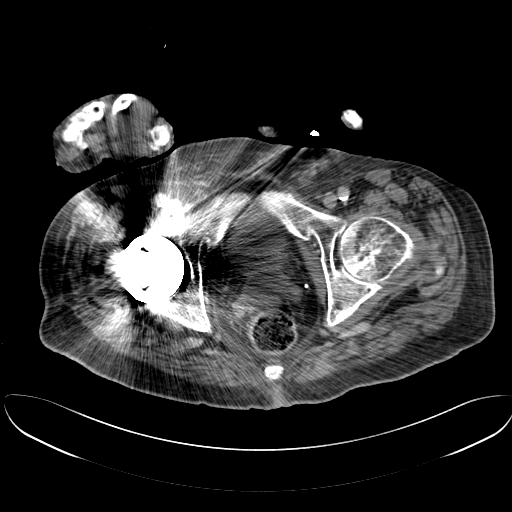
[im 22/94  soft-tissue]
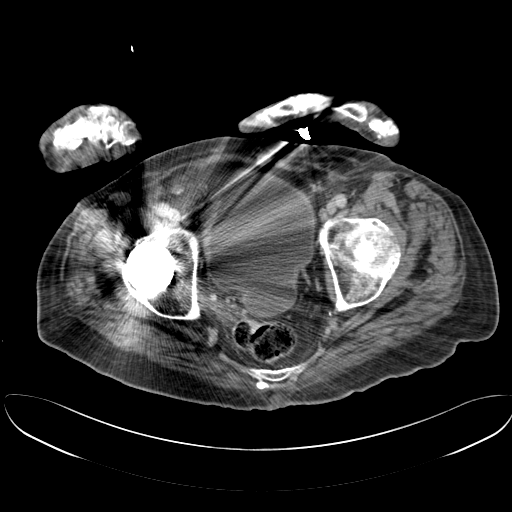
[im 28/94  soft-tissue]
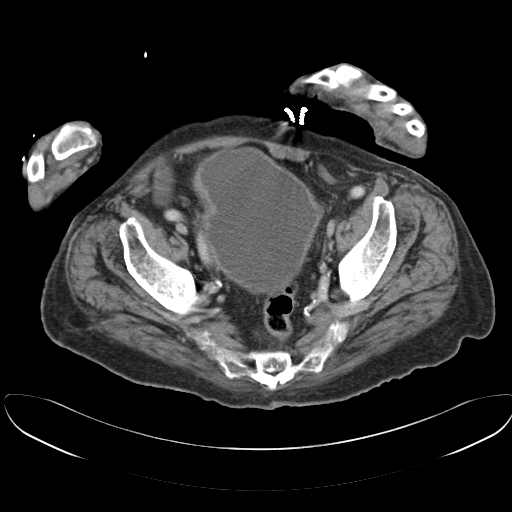
[im 39/94  soft-tissue]
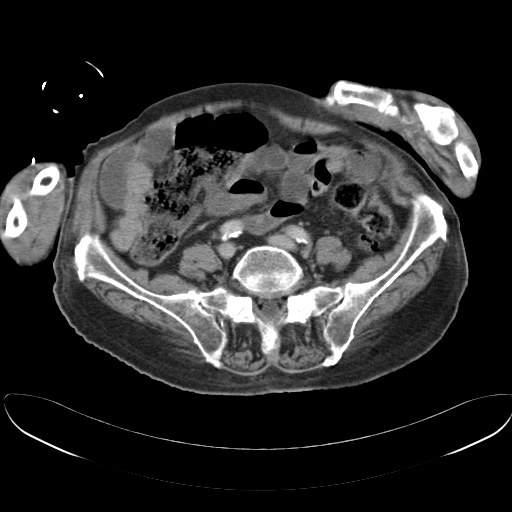
[im 44/94  soft-tissue]
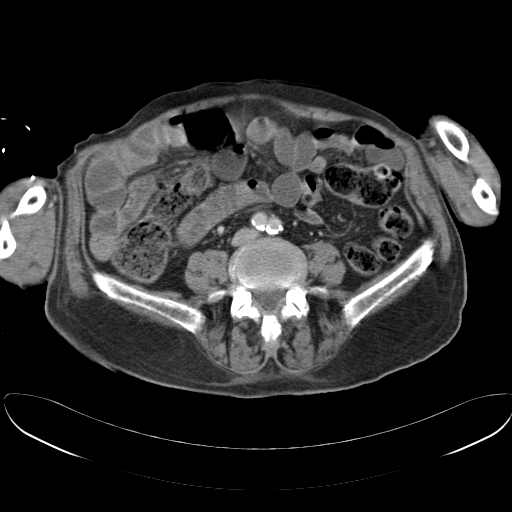
[im 50/94  soft-tissue]
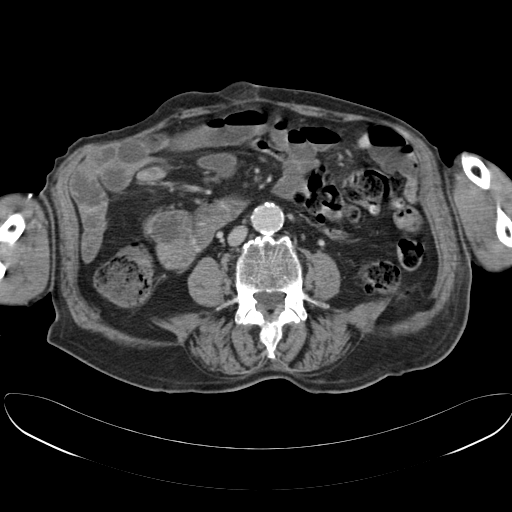
[im 61/94  soft-tissue]
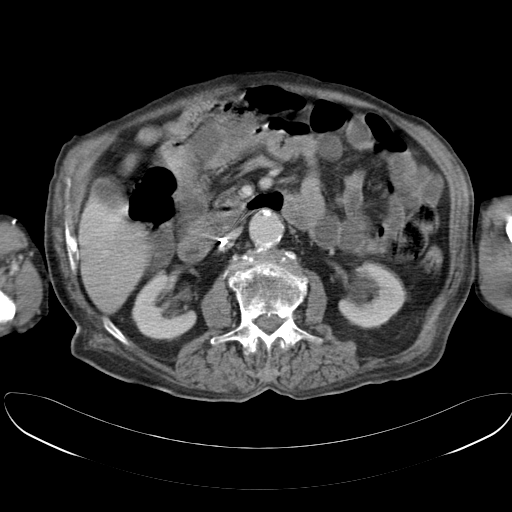
[im 66/94  soft-tissue]
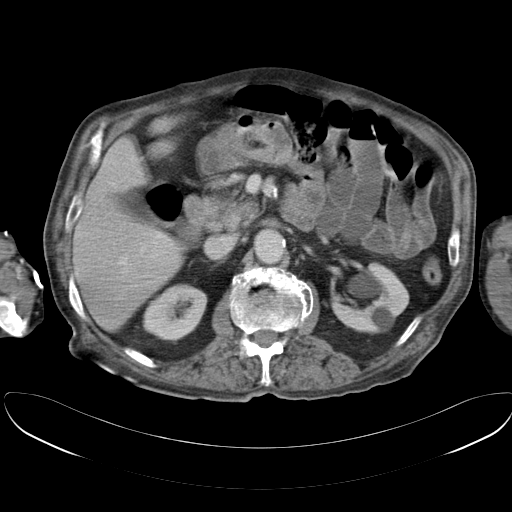
[im 66/94  bone]
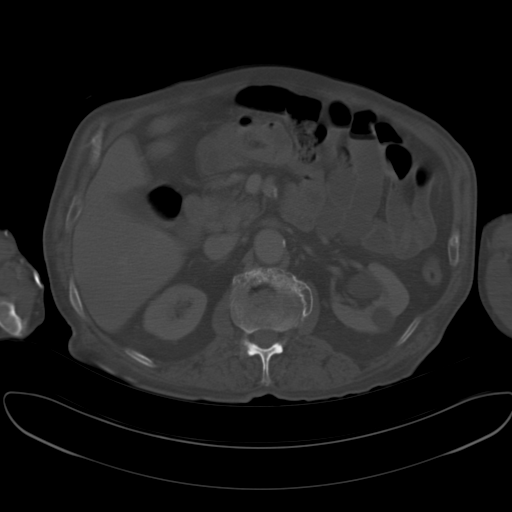
[im 72/94  soft-tissue]
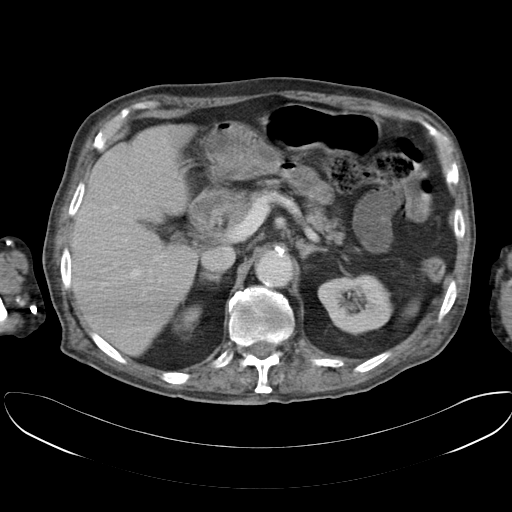
[im 83/94  soft-tissue]
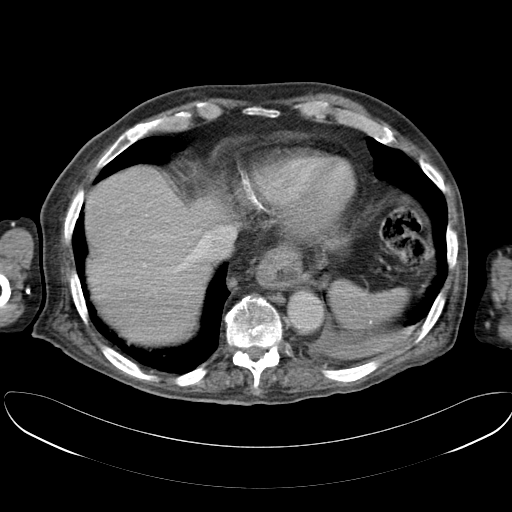
[im 88/94  soft-tissue]
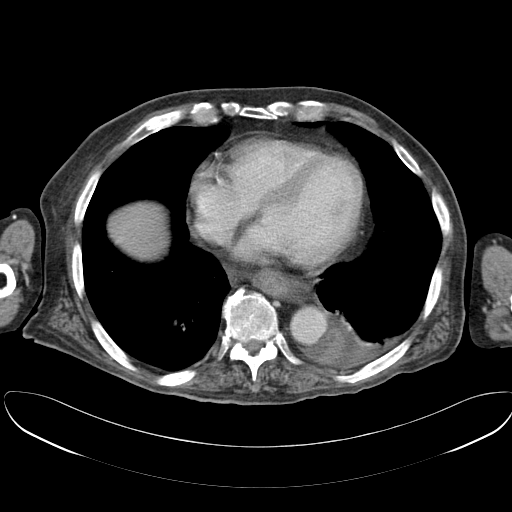

[Series 5: cor routine abd pel with · coronal · 0.74mm/px · 3 of 129 slices shown]
[im 43/129  soft-tissue]
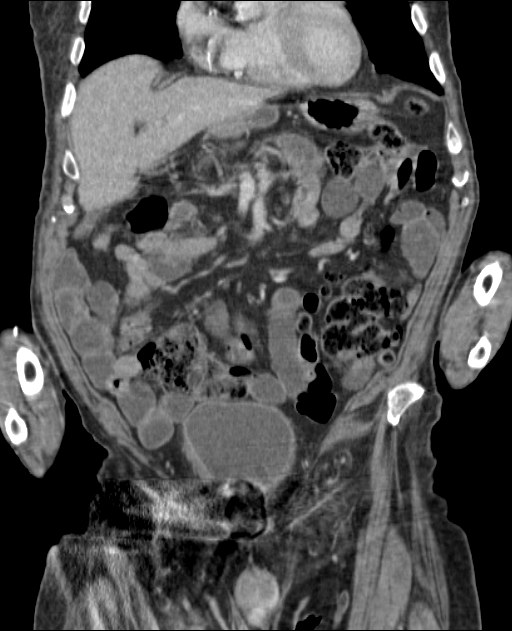
[im 57/129  soft-tissue]
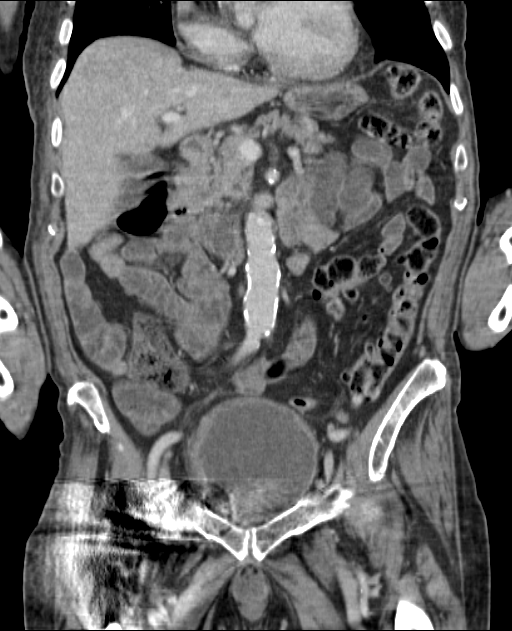
[im 72/129  soft-tissue]
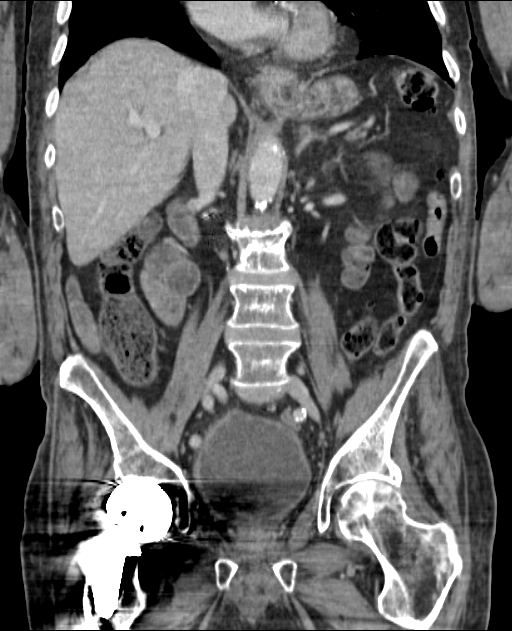

[15 of 46 positions shown; findings below may reference images not displayed]

FINDINGS: Lower Chest: Small left mixed attenuation pleural effusion
consistent with small hemothorax. There is associated dependent
atelectasis of the left lower lobe. No pneumothorax. Respiratory
motion limits evaluation for small pulmonary nodules. All heart size
within normal limits for age. Trace pericardial thickening versus
fluid appears similar compared to prior. Atherosclerotic
calcifications noted in the coronary arteries. Small sliding hiatal
hernia.

Abdomen: The images are moderately degraded by motion related
artifact. Unremarkable CT appearance of the stomach, duodenum,
adrenal glands and pancreas. Punctate calcifications throughout the
splenic parenchyma consistent with old granulomatous disease. Normal
hepatic contour and morphology. No discrete hepatic lesion.
Cholelithiasis. No secondary findings to suggest acute
cholecystitis. No evidence of hydronephrosis. No enhancing renal
mass or nephrolithiasis in either kidney. Stable 1.4 cm simple cyst
in the interpolar left kidney. Bilateral renal sinus cysts.

Colonic diverticular disease without CT evidence of active
inflammation. No evidence of obstruction or focal bowel wall
thickening. No free fluid or suspicious adenopathy.

Pelvis: Similar appearance of the bladder with diffusely irregular,
and trabeculated bladder wall. Left posterolateral diverticulum,
suspect Tiger diverticulum. This is also unchanged compared to
prior. No free fluid or suspicious adenopathy.

Bones/Soft Tissues: Acute displaced fracture of the posterolateral
aspect of the left tenth rib. Incompletely imaged right hip
arthroplasty prosthesis. No evidence of hardware complication in the
visualized portion. Multilevel degenerative disc disease. Chronic
appearing T12 compression fracture.

Vascular: IVC filter in place. Atherosclerotic vascular disease
without significant stenosis or aneurysmal dilatation.
IMPRESSION: 1. Displaced fracture of the posterolateral aspect of the left tenth
rib.
2. Small left hemothorax with associated left basilar subsegmental
atelectasis.
3. No evidence of acute injury to the abdomen or pelvis.
4. Cholelithiasis.
5. Colonic diverticular disease without CT evidence of active
inflammation.
6. Irregular and diffusely trabeculated bladder wall with a left
posterolateral Tiger diverticulum. Overall, findings appear similar
compared to 11/27/2012.
7. Chronic appearing T12 compression fracture.
8. IVC filter.
9. Diffuse atherosclerotic vascular calcifications.
